# Patient Record
Sex: Female | Born: 2005
Health system: Southern US, Community
[De-identification: ages and names within clinical notes are randomized; demographics above are authoritative.]

## PROBLEM LIST (undated history)

## (undated) DIAGNOSIS — J45990 Exercise induced bronchospasm: Secondary | ICD-10-CM

## (undated) DIAGNOSIS — Q825 Congenital non-neoplastic nevus: Secondary | ICD-10-CM

## (undated) DIAGNOSIS — Z8774 Personal history of (corrected) congenital malformations of heart and circulatory system: Secondary | ICD-10-CM

## (undated) HISTORY — DX: Personal history of (corrected) congenital malformations of heart and circulatory system: Z87.74

## (undated) HISTORY — DX: Congenital non-neoplastic nevus: Q82.5

## (undated) HISTORY — DX: Exercise induced bronchospasm: J45.990

---
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
## 2023-05-16 DIAGNOSIS — Z0000 Encounter for general adult medical examination without abnormal findings: Secondary | ICD-10-CM | POA: Diagnosis not present

## 2023-05-16 DIAGNOSIS — Z114 Encounter for screening for human immunodeficiency virus [HIV]: Secondary | ICD-10-CM | POA: Diagnosis not present

## 2023-05-16 DIAGNOSIS — Z13220 Encounter for screening for lipoid disorders: Secondary | ICD-10-CM | POA: Diagnosis not present

## 2023-05-29 DIAGNOSIS — Z0000 Encounter for general adult medical examination without abnormal findings: Secondary | ICD-10-CM | POA: Diagnosis not present

## 2023-06-11 ENCOUNTER — Ambulatory Visit (INDEPENDENT_AMBULATORY_CARE_PROVIDER_SITE_OTHER): Payer: Commercial Managed Care - PPO | Admitting: Family Medicine

## 2023-06-11 ENCOUNTER — Ambulatory Visit
Admission: RE | Admit: 2023-06-11 | Discharge: 2023-06-11 | Disposition: A | Discharge status: Home / Self Care | Payer: Commercial Managed Care - PPO | Source: Ambulatory Visit | Attending: Family Medicine | Admitting: Family Medicine

## 2023-06-11 ENCOUNTER — Encounter: Payer: Self-pay | Admitting: Family Medicine

## 2023-06-11 VITALS — BP 128/80 | Ht 65.0 in | Wt 180.0 lb

## 2023-06-11 DIAGNOSIS — M5416 Radiculopathy, lumbar region: Secondary | ICD-10-CM

## 2023-06-11 MED ORDER — PREDNISONE 10 MG PO TABS: ORAL_TABLET | ORAL | 0 refills | Status: AC

## 2023-06-11 NOTE — Progress Notes (Signed)
PCP: Lewis Moccasin, MD  Subjective:   HPI: Patient is a 17 y.o. female here for low back pain.  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 Reports having 2 months of low back pain with radiation down her left leg. Associated numbness and tingling. No bowel/bladder dysfunction. No acute injury or trauma. She's been doing rehab without improvement in those two months. Pain worse with sports.  Past Medical History:  Diagnosis Date   Congenital nevus of axilla    derm eval in 2008 --> benign   Exercise induced bronchospasm 2014   History of COVID-19 10/21/2020   Spontaneous ASD closure 2007   Dr. Darlis Loan    Current Outpatient Medications on File Prior to Visit  Medication Sig Dispense Refill   COVID-19 At Home Antigen Test [facility] COVID-19 HOME TEST) KIT use as directed 4 each 0   ondansetron (ZOFRAN-ODT) 4 MG disintegrating tablet Take 1 tablet (4 mg total) by mouth every 8 (eight) hours as needed for nausea or vomiting. 20 tablet 0   oseltamivir (TAMIFLU) 75 MG capsule Take 1 capsule (75 mg total) by mouth 2 (two) times daily. 10 capsule 0   No current facility-administered medications on file prior to visit.    Past Surgical History:  Procedure Laterality Date   SUBTALAR COALITION EXCISION Right 02/03/2021   Procedure: Excision right naviculocalcaneal coalition;  Surgeon: Toni Arthurs, MD;  Location: [facility];  Service: Orthopedics;  Laterality: Right;   TONSILLECTOMY  2010    No Known Allergies  BP 128/80   Ht 5\' 5"  (1.651 m)   Wt 180 lb (81.6 kg)   BMI 29.95 kg/m       No data to display              No data to display              Objective:  Physical Exam:  Gen: NAD, comfortable in exam room  Back: No gross deformity, scoliosis. TTP at L4-5 in midline without stepoffs or crepitus.  No paraspinal tenderness. FROM. Strength 4/5 right hip flexion, knee extension/flexion, ankle dorsiflexion.  5/5 other muscle  groups bilateral lower extremities. 2+ MSRs in patellar and achilles tendons, equal bilaterally. Negative SLRs - more discomfort on right though. Sensation diminished medial right lower leg.  Otherwise normal bilateral lower extremities.   Assessment & Plan:  1. Lumbar radiculopathy - unfortunately not improving with conservative treatment and weakness in right lower extremity though not in dermatomal distribution.  Start prednisone dose pack x 6 days.  X-rays ordered today - if normal will proceed with MRI.

## 2023-06-11 NOTE — Patient Instructions (Addendum)
Start prednisone - take x 6 days as directed. Don't take aleve or ibuprofen while on this. Get x-rays after you leave today. If these are normal we will proceed with an MRI. No change to your restrictions we discussed in the training room until we get MRI results.

## 2023-06-12 ENCOUNTER — Other Ambulatory Visit: Payer: Self-pay

## 2023-06-12 DIAGNOSIS — M5416 Radiculopathy, lumbar region: Secondary | ICD-10-CM

## 2023-06-13 DIAGNOSIS — M545 Low back pain, unspecified: Secondary | ICD-10-CM | POA: Diagnosis not present

## 2023-06-15 ENCOUNTER — Ambulatory Visit (HOSPITAL_BASED_OUTPATIENT_CLINIC_OR_DEPARTMENT_OTHER)
Admission: RE | Admit: 2023-06-15 | Discharge: 2023-06-15 | Disposition: A | Discharge status: Home / Self Care | Payer: Commercial Managed Care - PPO | Source: Ambulatory Visit | Attending: Family Medicine | Admitting: Family Medicine

## 2023-06-15 DIAGNOSIS — M4306 Spondylolysis, lumbar region: Secondary | ICD-10-CM | POA: Diagnosis not present

## 2023-06-15 DIAGNOSIS — M5416 Radiculopathy, lumbar region: Secondary | ICD-10-CM | POA: Insufficient documentation

## 2023-06-15 DIAGNOSIS — M5136 Other intervertebral disc degeneration, lumbar region: Secondary | ICD-10-CM | POA: Diagnosis not present

## 2023-06-15 DIAGNOSIS — M545 Low back pain, unspecified: Secondary | ICD-10-CM | POA: Diagnosis not present

## 2023-06-19 ENCOUNTER — Encounter: Payer: Self-pay | Admitting: Family Medicine

## 2023-06-19 ENCOUNTER — Ambulatory Visit (INDEPENDENT_AMBULATORY_CARE_PROVIDER_SITE_OTHER): Payer: Commercial Managed Care - PPO | Admitting: Family Medicine

## 2023-06-19 VITALS — BP 124/68 | Ht 65.0 in | Wt 180.0 lb

## 2023-06-19 DIAGNOSIS — M5416 Radiculopathy, lumbar region: Secondary | ICD-10-CM

## 2023-06-19 NOTE — Progress Notes (Signed)
PCP: Lewis Moccasin, MD  Subjective:   HPI: Patient is a 17 y.o. female here for low back pain.  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 Associated numbness and tingling. No bowel/bladder dysfunction. No acute injury or trauma. She's been doing rehab without improvement in those two months. Pain worse with sports.  9/3: Patient has done well on prednisone dose pack and working with trainers. Numbness into right lower extremity is gone and no weakness in right leg. Some mild discomfort in low back but has been resting since last visit and much better. No pain with extension. No bowel/bladder dysfunction.  Past Medical History:  Diagnosis Date   Congenital nevus of axilla    derm eval in 2008 --> benign   Exercise induced bronchospasm 2014   History of COVID-19 10/21/2020   Spontaneous ASD closure 2007   Dr. Darlis Loan    Current Outpatient Medications on File Prior to Visit  Medication Sig Dispense Refill   COVID-19 At Home Antigen Test [facility] COVID-19 HOME TEST) KIT use as directed 4 each 0   ondansetron (ZOFRAN-ODT) 4 MG disintegrating tablet Take 1 tablet (4 mg total) by mouth every 8 (eight) hours as needed for nausea or vomiting. 20 tablet 0   oseltamivir (TAMIFLU) 75 MG capsule Take 1 capsule (75 mg total) by mouth 2 (two) times daily. 10 capsule 0   predniSONE (DELTASONE) 10 MG tablet 6 tabs po day 1, 5 tabs po day 2, 4 tabs po day 3, 3 tabs po day 4, 2 tabs po day 5, 1 tab po day 6 21 tablet 0   No current facility-administered medications on file prior to visit.    Past Surgical History:  Procedure Laterality Date   SUBTALAR COALITION EXCISION Right 02/03/2021   Procedure: Excision right naviculocalcaneal coalition;  Surgeon: Toni Arthurs, MD;  Location: [facility];  Service: Orthopedics;  Laterality: Right;   TONSILLECTOMY  2010    No Known  Allergies  BP 124/68   Ht 5\' 5"  (1.651 m)   Wt 180 lb (81.6 kg)   BMI 29.95 kg/m       No data to display              No data to display              Objective:  Physical Exam:  Gen: NAD, comfortable in exam room  Back: No gross deformity, scoliosis. No tenderness.  No midline or bony TTP. FROM. Strength LEs 5/5 all muscle groups.   Negative SLRs. Sensation intact to light touch bilaterally.   Assessment & Plan:  1. Lumbar radiculopathy - symptoms resolved and strength back to normal following prednisone dose pack, working with Event organiser.  MRI was reviewed and reassuring - she has two very small disc bulges but these are non-compressive.  Congenital pars defect on one side - no edema on either side of this and not the cause of her issues.  She is cleared for sports without restrictions.  Continue working with trainers at [employer].  Ibuprofen or aleve if needed.

## 2023-06-21 ENCOUNTER — Other Ambulatory Visit: Payer: Commercial Managed Care - PPO

## 2024-05-30 DIAGNOSIS — Z0000 Encounter for general adult medical examination without abnormal findings: Secondary | ICD-10-CM | POA: Diagnosis not present
